# Patient Record
Sex: Male | Born: 1995 | Race: White | Hispanic: No | Marital: Single | Smoking: Current every day smoker
Health system: Southern US, Community
[De-identification: ages and names within clinical notes are randomized; demographics above are authoritative.]

## PROBLEM LIST (undated history)

## (undated) DIAGNOSIS — J45909 Unspecified asthma, uncomplicated: Secondary | ICD-10-CM

## (undated) HISTORY — PX: NO PAST SURGERIES: SHX2092

---
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
## 2016-12-07 IMAGING — CR DG CHEST 2V
2 series · 2 of 2 positions shown · non-contrast
Comparison: None.

CLINICAL DATA: Acute onset of nausea and vomiting. Generalized
chest pain and shortness of breath. Shaking and hyperventilation.
Initial encounter.

EXAM:
CHEST  2 VIEW

[chest pa]
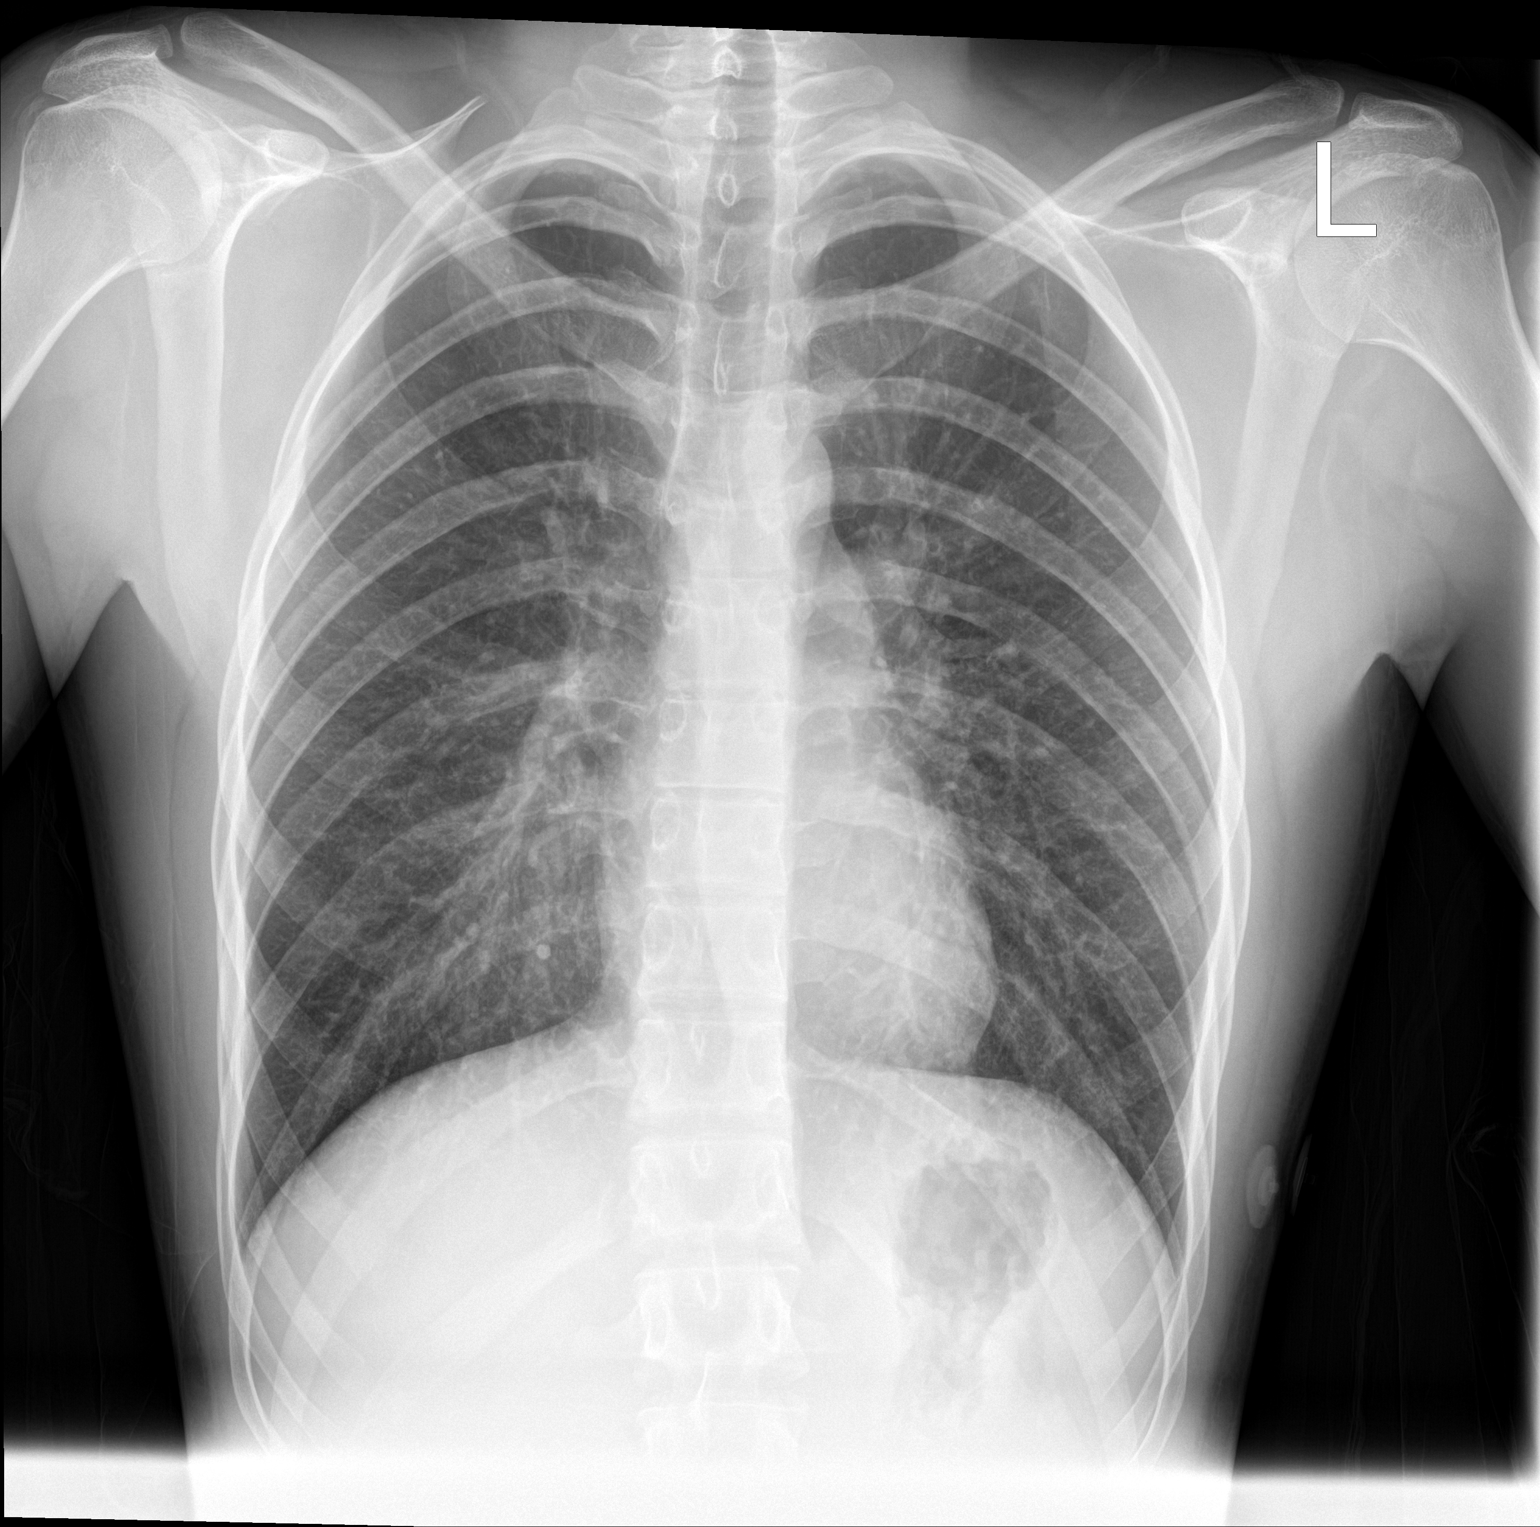

[chest lat]
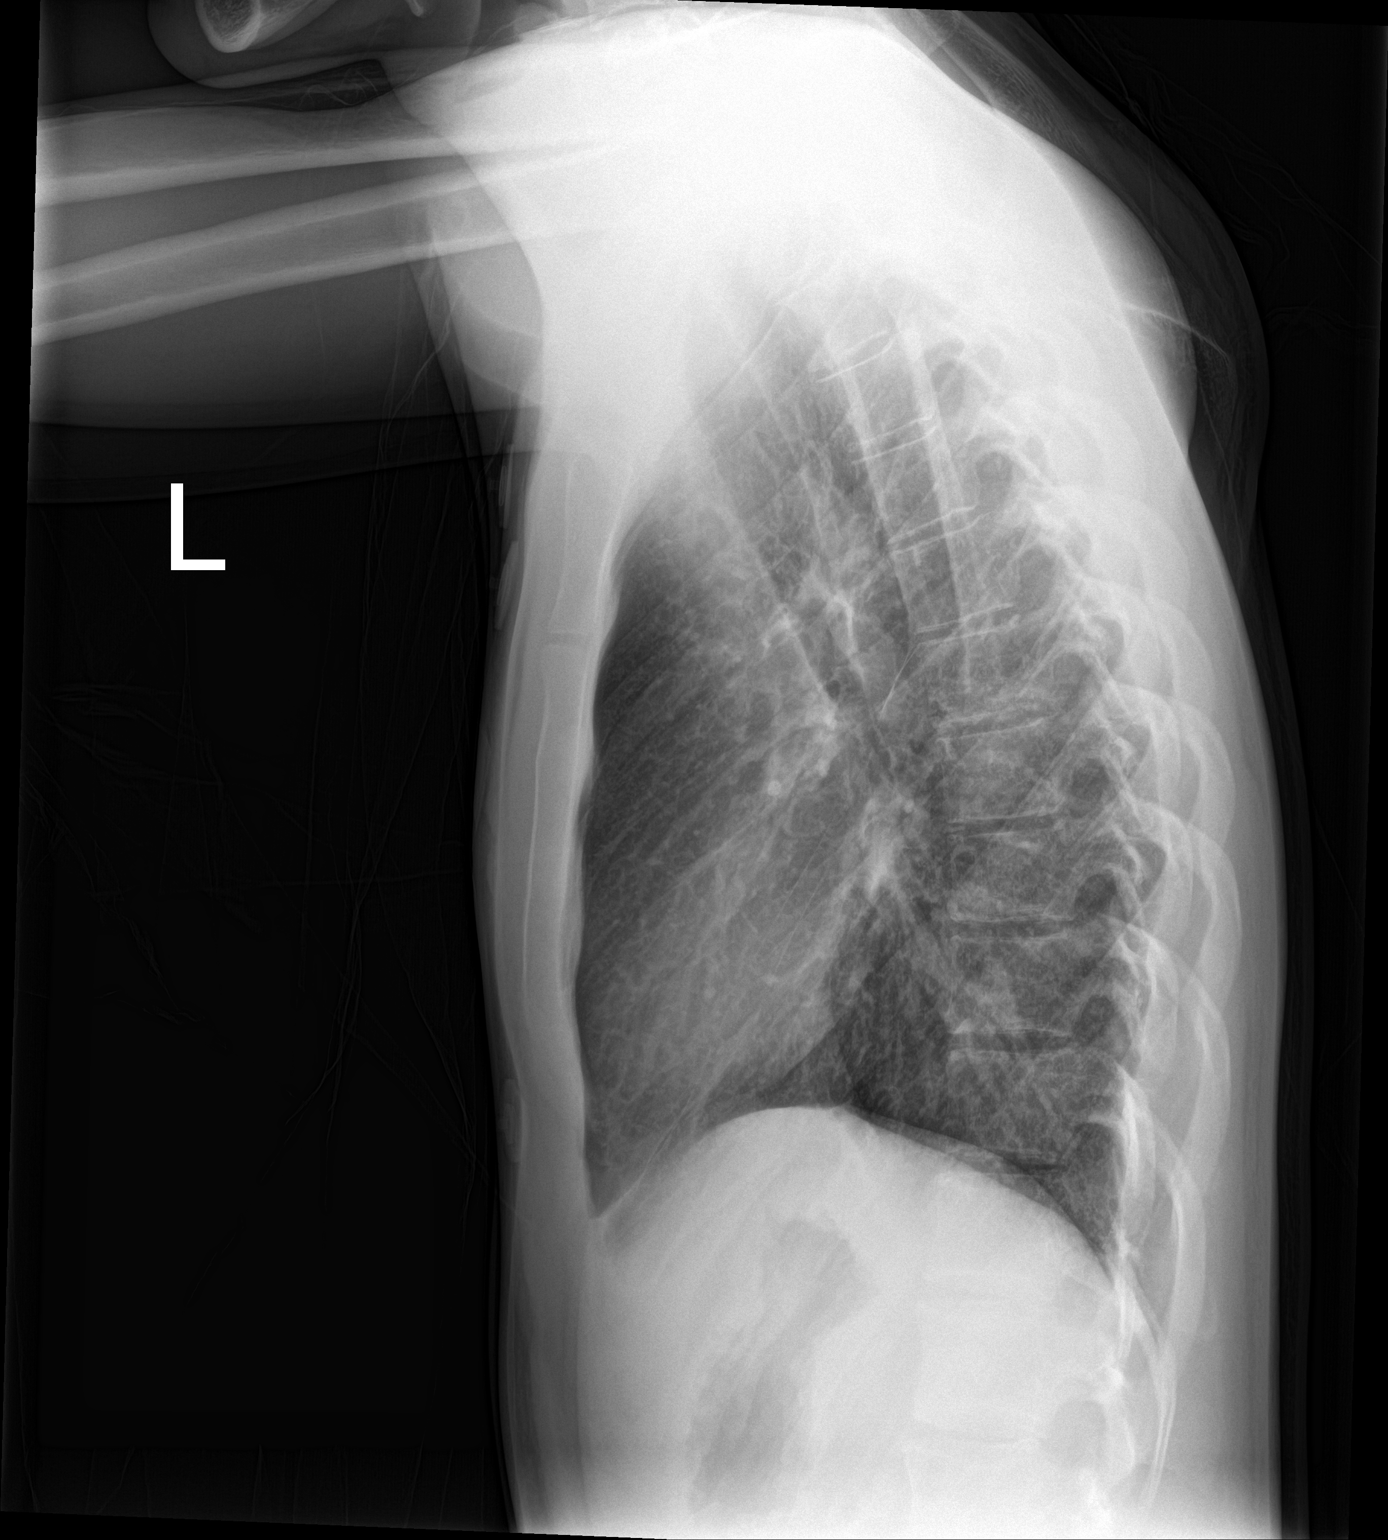

[2 of 2 positions shown; findings below may reference images not displayed]

FINDINGS: The lungs are well-aerated and clear. There is no evidence of focal
opacification, pleural effusion or pneumothorax.

The heart is normal in size; the mediastinal contour is within
normal limits. No acute osseous abnormalities are seen.
IMPRESSION: No acute cardiopulmonary process seen.

## 2017-05-01 IMAGING — CR DG CHEST 2V
1 series · 2 of 2 positions shown · non-contrast
Comparison: 12/11/2014

CLINICAL DATA: Chest pain since yesterday. Aching pain shooting to
back. No hx diabetes or htn. Current smoker, 1 pack a day. Shielded.

EXAM:
CHEST - 2 VIEW

[Series 1: dg chest 2 view · 0.14mm/px · 2 of 2 slices shown]
[im 1/2]
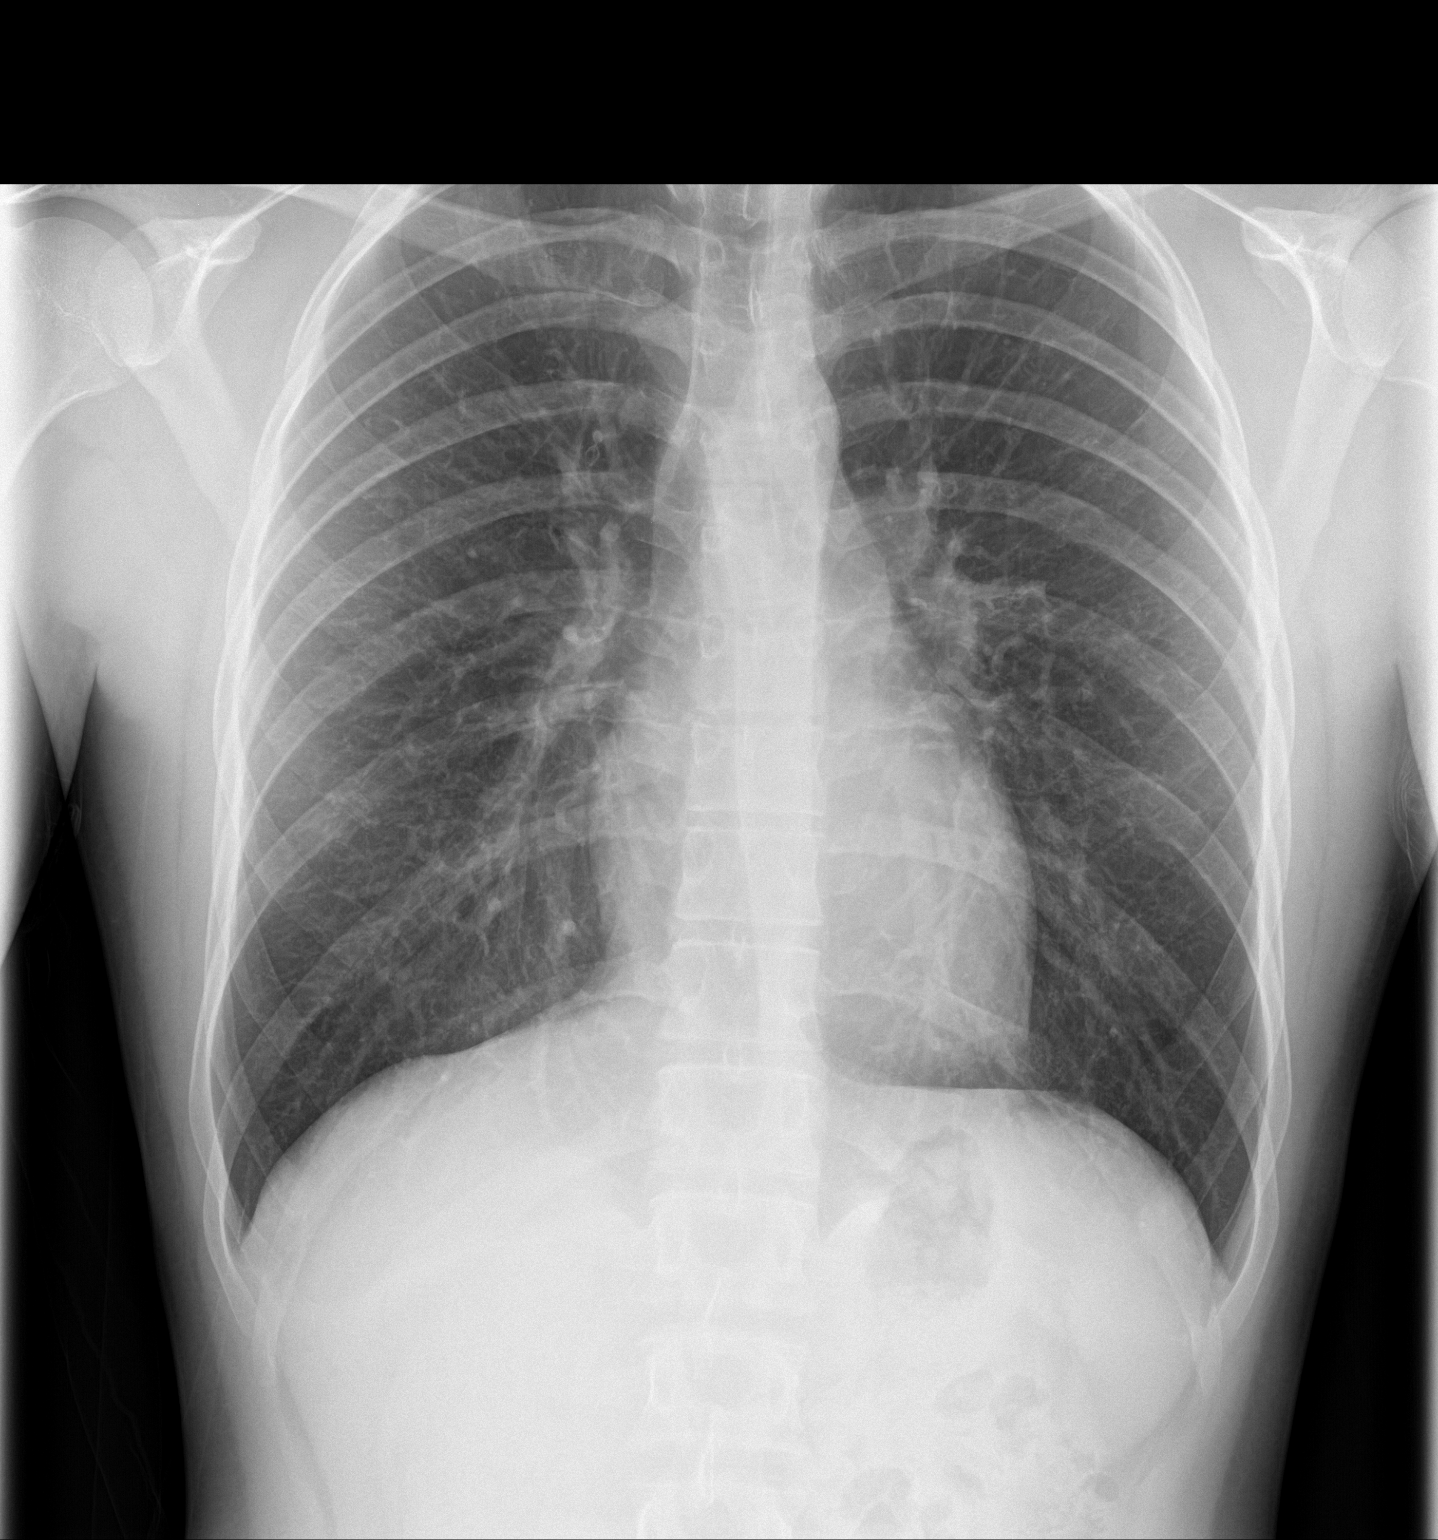
[im 2/2]
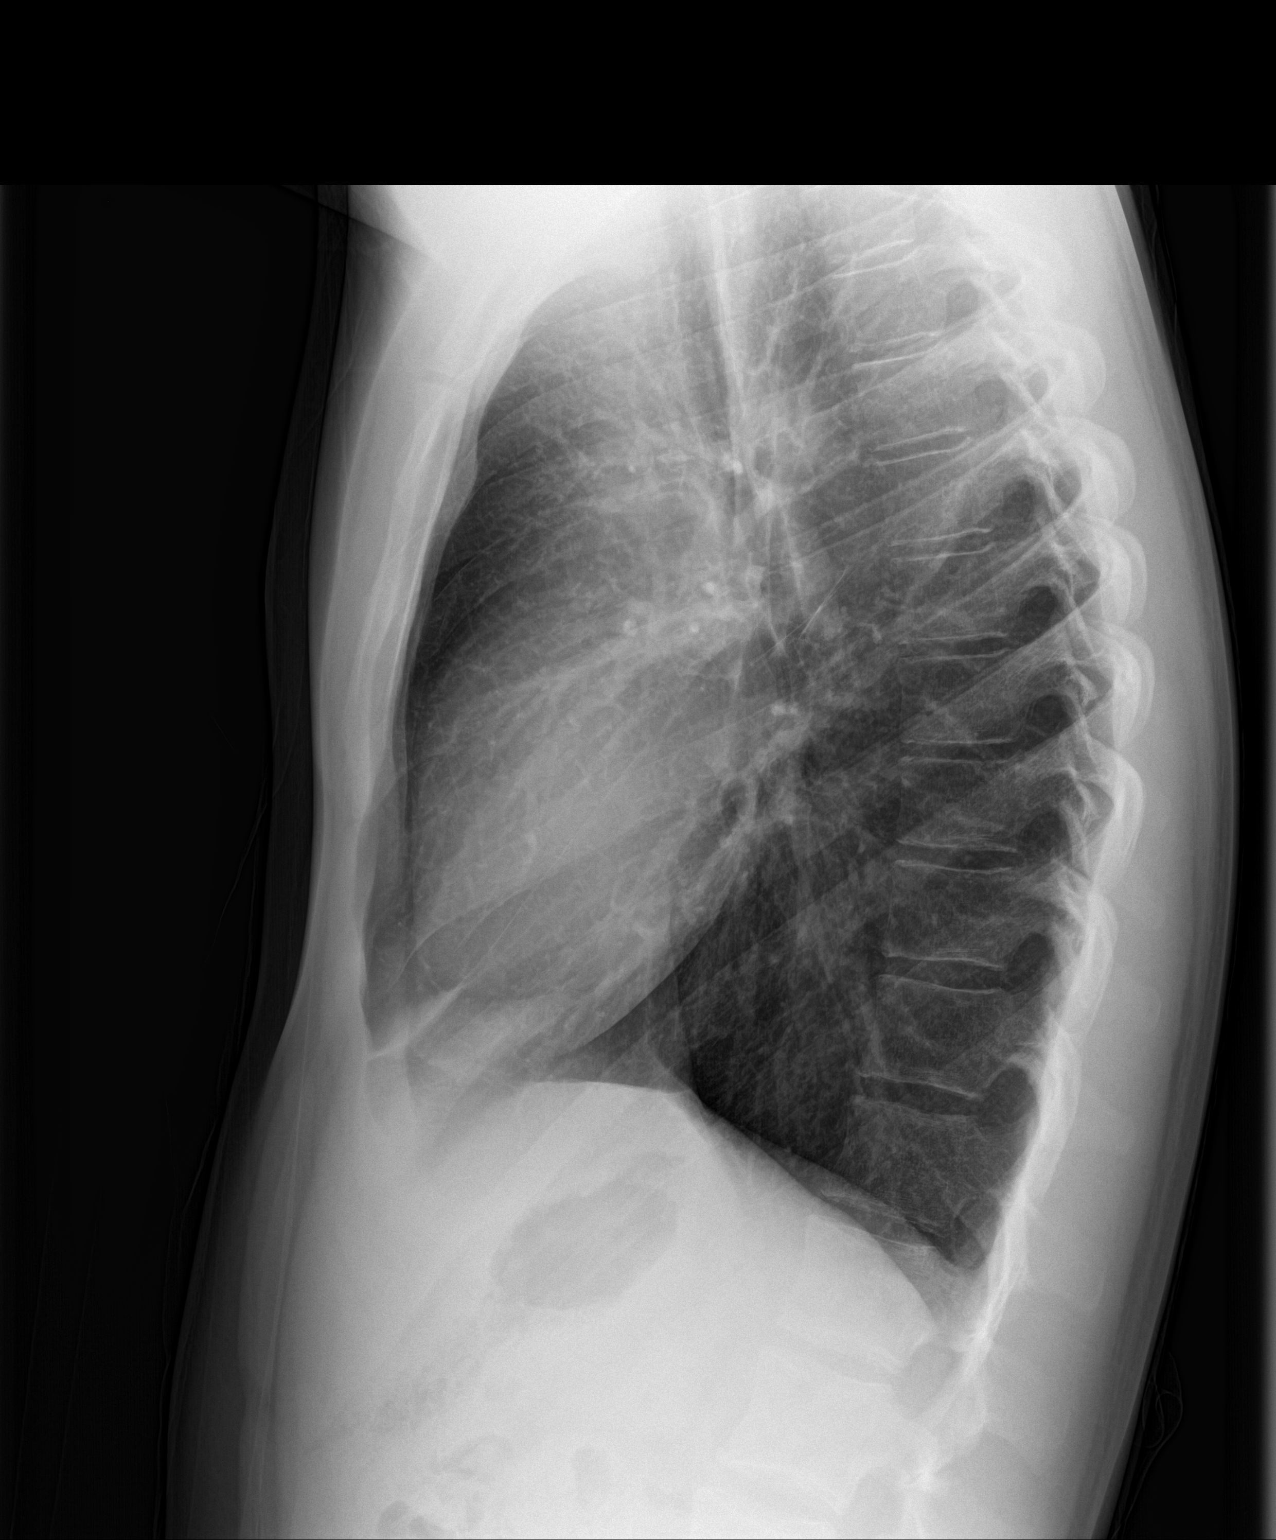

[2 of 2 positions shown; findings below may reference images not displayed]

FINDINGS: Lungs are clear. Heart size and mediastinal contours are within
normal limits.
No effusion.  No pneumothorax.
Visualized skeletal structures are unremarkable.
IMPRESSION: No acute cardiopulmonary disease.

## 2018-10-02 ENCOUNTER — Other Ambulatory Visit: Payer: Self-pay | Admitting: Physician Assistant

## 2018-10-02 DIAGNOSIS — N50812 Left testicular pain: Secondary | ICD-10-CM

## 2018-10-04 ENCOUNTER — Other Ambulatory Visit: Payer: Self-pay

## 2018-10-04 ENCOUNTER — Ambulatory Visit
Admission: RE | Admit: 2018-10-04 | Discharge: 2018-10-04 | Disposition: A | Discharge status: Home / Self Care | Payer: Self-pay | Source: Ambulatory Visit | Attending: Physician Assistant | Admitting: Physician Assistant

## 2018-10-04 ENCOUNTER — Encounter (INDEPENDENT_AMBULATORY_CARE_PROVIDER_SITE_OTHER): Payer: Self-pay

## 2018-10-04 DIAGNOSIS — N50812 Left testicular pain: Secondary | ICD-10-CM | POA: Insufficient documentation

## 2018-11-04 ENCOUNTER — Ambulatory Visit: Payer: Self-pay | Admitting: Urology

## 2018-11-04 ENCOUNTER — Encounter: Payer: Self-pay | Admitting: Urology

## 2019-10-02 ENCOUNTER — Other Ambulatory Visit: Payer: Self-pay

## 2019-10-02 ENCOUNTER — Ambulatory Visit
Admission: EM | Admit: 2019-10-02 | Discharge: 2019-10-02 | Disposition: A | Discharge status: Home / Self Care | Payer: Medicaid Other | Attending: Family Medicine | Admitting: Family Medicine

## 2019-10-02 DIAGNOSIS — L259 Unspecified contact dermatitis, unspecified cause: Secondary | ICD-10-CM

## 2019-10-02 MED ORDER — PREDNISONE 10 MG PO TABS
ORAL_TABLET | ORAL | 0 refills | Status: AC
Start: 2019-10-02 — End: 2019-10-08

## 2019-10-02 NOTE — Discharge Instructions (Signed)
You were seen in the urgent care today for a rash.  Take medications as prescribed. Follow up with your PCP as needed.   Dr. Rachael Darby

## 2019-10-02 NOTE — ED Provider Notes (Signed)
[facility]    CSN: [id number] Arrival date & time: 10/02/19  0844      History   Chief Complaint Chief Complaint  Patient presents with  . Rash    HPI LEMONTE AL is a 24 y.o. male.   HPI  Patient crawled under a house for his job on Tuesday and Wednesday. He works with heating and duct work.  There was plastic and dirt underneath the house. Patient started itching after he woke up Wednesday morning.  Rash started on right flank and spread to his thighs. Redness began Tuesday night. Nothing tried for rash. He was wearing a short sleeve shirt, shorts and boots.   He does not recall seeing poison ivy and poison oak or any green plants.   Past Medical History:  Diagnosis Date  . Asthma     There are no problems to display for this patient.   Past Surgical History:  Procedure Laterality Date  . NO PAST SURGERIES         Home Medications    Prior to Admission medications   Medication Sig Start Date End Date Taking? Authorizing Provider  predniSONE (DELTASONE) 10 MG tablet Take 6 tablets (60 mg total) by mouth daily for 1 day, THEN 5 tablets (50 mg total) daily for 1 day, THEN 4 tablets (40 mg total) daily for 1 day, THEN 3 tablets (30 mg total) daily for 1 day, THEN 2 tablets (20 mg total) daily for 1 day, THEN 1 tablet (10 mg total) daily for 1 day. 10/02/19 10/08/19  Katha Cabal, DO    Family History History reviewed. No pertinent family history.  Social History Social History   Tobacco Use  . Smoking status: Current Every Day Smoker    Packs/day: 0.50    Types: Cigarettes  . Smokeless tobacco: Never Used  Vaping Use  . Vaping Use: Every day  Substance Use Topics  . Alcohol use: Yes    Comment: occasionally  . Drug use: Yes    Types: Marijuana     Allergies   Patient has no known allergies.   Review of Systems Review of Systems  Constitutional: Negative for fever.  HENT: Negative for congestion and sore throat.   Eyes: Negative  for itching.  Respiratory: Negative for cough and shortness of breath.   Cardiovascular: Negative for chest pain.  Gastrointestinal: Negative for abdominal pain, diarrhea and vomiting.  Genitourinary: Negative for dysuria.  Musculoskeletal: Negative for back pain.  Neurological: Negative for dizziness and light-headedness.  All other systems reviewed and are negative.    Physical Exam Triage Vital Signs ED Triage Vitals  Enc Vitals Group     BP 10/02/19 1010 (!) 123/51     Pulse Rate 10/02/19 1010 62     Resp 10/02/19 1010 18     Temp 10/02/19 1010 98 F (36.7 C)     Temp Source 10/02/19 1010 Oral     SpO2 10/02/19 1010 100 %     Weight 10/02/19 1008 165 lb (74.8 kg)     Height 10/02/19 1008 5\' 11"  (1.803 m)     Head Circumference --      Peak Flow --      Pain Score 10/02/19 1007 6     Pain Loc --      Pain Edu? --      Excl. in GC? --    No data found.  Updated Vital Signs BP (!) 123/51 (BP Location: Left Arm)  Pulse 62   Temp 98 F (36.7 C) (Oral)   Resp 18   Ht 5\' 11"  (1.803 m)   Wt 165 lb (74.8 kg)   SpO2 100%   BMI 23.01 kg/m   Visual Acuity Right Eye Distance:   Left Eye Distance:   Bilateral Distance:    Right Eye Near:   Left Eye Near:    Bilateral Near:     Physical Exam Vitals and nursing note reviewed.  Constitutional:      General: He is not in acute distress.    Appearance: Normal appearance.  HENT:     Head: Normocephalic and atraumatic.     Nose: Nose normal.     Mouth/Throat:     Mouth: Mucous membranes are moist.     Pharynx: Oropharynx is clear. No oropharyngeal exudate or posterior oropharyngeal erythema.  Eyes:     Extraocular Movements: Extraocular movements intact.     Conjunctiva/sclera: Conjunctivae normal.  Cardiovascular:     Rate and Rhythm: Normal rate and regular rhythm.     Pulses: Normal pulses.  Pulmonary:     Effort: Pulmonary effort is normal.     Breath sounds: Normal breath sounds.  Musculoskeletal:         General: Normal range of motion.     Cervical back: Normal range of motion.  Skin:    General: Skin is warm and dry.     Capillary Refill: Capillary refill takes less than 2 seconds.     Findings: Erythema and rash present.     Comments: erythematous patches on with diffuse excoriations on bilateral thighs and right flank   Neurological:     Mental Status: He is alert and oriented to person, place, and time.      UC Treatments / Results  Labs (all labs ordered are listed, but only abnormal results are displayed) Labs Reviewed - No data to display  EKG   Radiology No results found.  Procedures Procedures (including critical care time)  Medications Ordered in UC Medications - No data to display  Initial Impression / Assessment and Plan / UC Course  I have reviewed the triage vital signs and the nursing notes.  Pertinent labs & imaging results that were available during my care of the patient were reviewed by me and considered in my medical decision making (see chart for details).     Patient with 3 days of pruritic itchy rash after crawl underneath a house with skin exposed.  Will treat for likely irritant vs contact dermatitis with steroid taper and oral benadryl.   Final Clinical Impressions(s) / UC Diagnoses   Final diagnoses:  Contact dermatitis, unspecified contact dermatitis type, unspecified trigger     Discharge Instructions     You were seen in the urgent care today for a rash.  Take medications as prescribed. Follow up with your PCP as needed.   Dr.     ED Prescriptions    Medication Sig Dispense Auth. Provider   predniSONE (DELTASONE) 10 MG tablet Take 6 tablets (60 mg total) by mouth daily for 1 day, THEN 5 tablets (50 mg total) daily for 1 day, THEN 4 tablets (40 mg total) daily for 1 day, THEN 3 tablets (30 mg total) daily for 1 day, THEN 2 tablets (20 mg total) daily for 1 day, THEN 1 tablet (10 mg total) daily for 1 day. 21 tablet  Rachael Darby, DO     PDMP not reviewed this encounter.   Airen Dales,  Seward Meth, DO 10/02/19 1055

## 2019-10-02 NOTE — ED Triage Notes (Signed)
Patient states that he started having a rash after crawling under a house on Tuesday and Wednesday. States that the rash is on his torso and upper thigh. States that the area is itchy. Unsure of what he may have come in to contact with.

## 2022-10-18 ENCOUNTER — Ambulatory Visit
Admission: EM | Admit: 2022-10-18 | Discharge: 2022-10-18 | Disposition: A | Discharge status: Home / Self Care | Payer: Self-pay | Attending: Family Medicine | Admitting: Family Medicine

## 2022-10-18 ENCOUNTER — Ambulatory Visit (INDEPENDENT_AMBULATORY_CARE_PROVIDER_SITE_OTHER): Payer: Self-pay

## 2022-10-18 ENCOUNTER — Other Ambulatory Visit: Payer: Self-pay

## 2022-10-18 DIAGNOSIS — S39012A Strain of muscle, fascia and tendon of lower back, initial encounter: Secondary | ICD-10-CM

## 2022-10-18 DIAGNOSIS — M545 Low back pain, unspecified: Secondary | ICD-10-CM

## 2022-10-18 MED ORDER — IBUPROFEN 800 MG PO TABS
800.0000 mg | ORAL_TABLET | Freq: Once | ORAL | Status: AC
  Administered 2022-10-18: 800 mg via ORAL

## 2022-10-18 MED ORDER — CYCLOBENZAPRINE HCL 10 MG PO TABS: 10.0000 mg | ORAL_TABLET | Freq: Three times a day (TID) | ORAL | 0 refills | Status: AC | PRN

## 2022-10-18 MED ORDER — NAPROXEN 500 MG PO TABS: 500.0000 mg | ORAL_TABLET | Freq: Two times a day (BID) | ORAL | 0 refills | Status: AC

## 2022-10-18 NOTE — ED Triage Notes (Addendum)
Was lifting an HVAC unit yesterday and started having mid to lowr back pain shortly afterwards.

## 2022-10-18 NOTE — Discharge Instructions (Addendum)
If medication was prescribed, stop by the pharmacy to pick up your prescriptions.  For your back pain, Take 1500 mg Tylenol twice a day, take muscle relaxer (Flexeril) 3 times a day, take Naprosyn twice a day,  as needed for pain. Consider stopping by the pharmacy or dollar store to pick up some Lidocaine patches. Apply for 12 hours and then remove.   Watch for worsening symptoms such as an increasing weakness or loss of sensation in your arms or legs, increasing pain and/or the loss of bladder or bowel function. Should any of these occur, go to the emergency department immediately.

## 2022-10-18 NOTE — ED Provider Notes (Signed)
[facility]    CSN: [id number] Arrival date & time: 10/18/22  0623      History   Chief Complaint Chief Complaint  Patient presents with   Back Pain    HPI  HPI Matthew Nelson is a 27 y.o. male.   Matthew Nelson presents for low back pain that started yesterday after lifting a HVAC unit.  The unit weighed more than 50 lbs but he had a partner helping him lift it.  Has had similar back pain before in the same area after lifting. Nothing tried for pain.  Pain worse with bending down and gets better with nothing. Heating pads have helped him in the pads before.  Pain radiates up his back but no neck pain.  No radiation to his legs.  Pain rated 7/0 while sitting but goes to a 10/10 and is described as sharp.  His job knows he hurt his back. Denies falls or trauma.   Denies weakness, numbness, tingling, dysuria, leg pain, leg weakness, abdominal pain, abdominal swelling, chest pain, incontinence, pelvic pain, perianal numbness.     Past Medical History:  Diagnosis Date   Asthma     There are no problems to display for this patient.   Past Surgical History:  Procedure Laterality Date   NO PAST SURGERIES         Home Medications    Prior to Admission medications   Medication Sig Start Date End Date Taking? Authorizing Provider  cyclobenzaprine (FLEXERIL) 10 MG tablet Take 1 tablet (10 mg total) by mouth 3 (three) times daily as needed for muscle spasms. 10/18/22  Yes Kushi Kun, DO  naproxen (NAPROSYN) 500 MG tablet Take 1 tablet (500 mg total) by mouth 2 (two) times daily with a meal. 10/18/22  Yes Corretta Munce, Seward Meth, DO    Family History History reviewed. No pertinent family history.  Social History Social History   Tobacco Use   Smoking status: Every Day    Current packs/day: 0.50    Types: Cigarettes   Smokeless tobacco: Never  Vaping Use   Vaping status: Every Day  Substance Use Topics   Alcohol use: Yes    Comment: occasionally   Drug use: Yes     Types: Marijuana     Allergies   Patient has no known allergies.   Review of Systems Review of Systems: egative unless otherwise stated in HPI.      Physical Exam Triage Vital Signs ED Triage Vitals  Encounter Vitals Group     BP      Systolic BP Percentile      Diastolic BP Percentile      Pulse      Resp      Temp      Temp src      SpO2      Weight      Height      Head Circumference      Peak Flow      Pain Score      Pain Loc      Pain Education      Exclude from Growth Chart    No data found.  Updated Vital Signs BP 135/64   Pulse 75   Resp 18   SpO2 100%   Visual Acuity Right Eye Distance:   Left Eye Distance:   Bilateral Distance:    Right Eye Near:   Left Eye Near:    Bilateral Near:     Physical Exam GEN: well  appearing male in no acute distress  CVS: well perfused, strong distal pulses  RESP: speaking in full sentences without pause, no respiratory distress  MSK:  Lumbar spine: - Inspection: no gross deformity or asymmetry, swelling or ecchymosis. No skin changes  - Palpation: L4-5 TTP over the spinous processes with right  lumbar paraspinal muscle tenderness, no SI joint tenderness bilaterally - ROM: limited active ROM of the lumbar spine in flexion and extension but due to pain - Strength: 5/5 strength of lower extremity in L4-S1 nerve root distributions b/l - Neuro: sensation intact in the L4-S1 nerve root distribution b/l, 2+ L4 and S1 reflexes - Special testing: Negative straight leg raise SKIN: warm, dry, no overly skin rash or erythema    UC Treatments / Results  Labs (all labs ordered are listed, but only abnormal results are displayed) Labs Reviewed - No data to display  EKG   Radiology DG Lumbar Spine Complete  Result Date: 10/18/2022 CLINICAL DATA:  Low back pain after lifting EXAM: LUMBAR SPINE - COMPLETE 4+ VIEW COMPARISON:  None Available. FINDINGS: There is no evidence of lumbar spine fracture. Mild reversal of  the normal lordosis. No spondylolisthesis. Anterior spurring from the inferior endplate of L5. Alignment is normal. Intervertebral disc spaces are maintained. IMPRESSION: Negative for fracture or other acute finding. Electronically Signed   By: Corlis Leak M.D.   On: 10/18/2022 10:30     Procedures Procedures (including critical care time)  Medications Ordered in UC Medications  ibuprofen (ADVIL) tablet 800 mg (800 mg Oral Given 10/18/22 0939)    Initial Impression / Assessment and Plan / UC Course  I have reviewed the triage vital signs and the nursing notes.  Pertinent labs & imaging results that were available during my care of the patient were reviewed by me and considered in my medical decision making (see chart for details).      Pt is a 27 y.o.  male with chronic low back pain who presents for acute flare of low back pain after lifting a HVAC unit yesterday at work.  Has history of low back pain. Offered IM vs po pain control and he requested po pain control.  Given ibuprofen 800 mg po.  Obtained lumbar plain films.  Xray personally interpreted by me were unremarkable for fracture, or significant malalignment.  Radiologist report reviewed and notes anterior spurring from the inferior endplate of L5.  Patient to gradually return to normal activities, as tolerated and continue ordinary activities within the limits permitted by pain. Prescribed Naproxen sodium  and muscle relaxer  for pain relief.  Advised patient to avoid other NSAIDs while taking Naprosyn. Tylenol and Lidocaine patches PRN for multimodal pain relief. Counseled patient on red flag symptoms and when to seek immediate care.  No red flags suggesting cauda equina syndrome or progressive major motor weakness. Patient to follow up with orthopedic provider if symptoms do not improve with conservative treatment.  Return and ED precautions given.    Discussed MDM, treatment plan and plan for follow-up with patient who agrees with  plan.   Final Clinical Impressions(s) / UC Diagnoses   Final diagnoses:  Strain of lumbar region, initial encounter     Discharge Instructions      If medication was prescribed, stop by the pharmacy to pick up your prescriptions.  For your back pain, Take 1500 mg Tylenol twice a day, take muscle relaxer (Flexeril) 3 times a day, take Naprosyn twice a day,  as needed for pain.  Consider stopping by the pharmacy or dollar store to pick up some Lidocaine patches. Apply for 12 hours and then remove.   Watch for worsening symptoms such as an increasing weakness or loss of sensation in your arms or legs, increasing pain and/or the loss of bladder or bowel function. Should any of these occur, go to the emergency department immediately.       ED Prescriptions     Medication Sig Dispense Auth. Provider   naproxen (NAPROSYN) 500 MG tablet Take 1 tablet (500 mg total) by mouth 2 (two) times daily with a meal. 30 tablet Tyreque Finken, DO   cyclobenzaprine (FLEXERIL) 10 MG tablet Take 1 tablet (10 mg total) by mouth 3 (three) times daily as needed for muscle spasms. 30 tablet Katha Cabal, DO      PDMP not reviewed this encounter.   Katha Cabal, DO 10/18/22 1104

## 2024-03-04 ENCOUNTER — Ambulatory Visit
Admission: EM | Admit: 2024-03-04 | Discharge: 2024-03-04 | Disposition: A | Discharge status: Home / Self Care | Payer: Self-pay | Source: Home / Self Care

## 2024-03-04 ENCOUNTER — Encounter: Payer: Self-pay | Admitting: Emergency Medicine

## 2024-03-04 DIAGNOSIS — J069 Acute upper respiratory infection, unspecified: Secondary | ICD-10-CM

## 2024-03-04 DIAGNOSIS — J029 Acute pharyngitis, unspecified: Secondary | ICD-10-CM

## 2024-03-04 LAB — POCT RAPID STREP A (OFFICE): Rapid Strep A Screen: NEGATIVE

## 2024-03-04 MED ORDER — PROMETHAZINE-DM 6.25-15 MG/5ML PO SYRP: 5.0000 mL | ORAL_SOLUTION | Freq: Four times a day (QID) | ORAL | 0 refills | Status: AC | PRN

## 2024-03-04 MED ORDER — BENZONATATE 100 MG PO CAPS: 200.0000 mg | ORAL_CAPSULE | Freq: Three times a day (TID) | ORAL | 0 refills | Status: AC

## 2024-03-04 MED ORDER — IPRATROPIUM BROMIDE 0.06 % NA SOLN: 2.0000 | Freq: Four times a day (QID) | NASAL | 12 refills | Status: AC

## 2024-03-06 ENCOUNTER — Ambulatory Visit (HOSPITAL_COMMUNITY): Payer: Self-pay

## 2024-03-06 LAB — CULTURE, GROUP A STREP (THRC)
